# Patient Record
Sex: Female | Born: 1975 | Race: White | Hispanic: No | Marital: Single | Smoking: Never smoker
Health system: Southern US, Community
[De-identification: ages and names within clinical notes are randomized; demographics above are authoritative.]

---

## 2013-01-19 DIAGNOSIS — J45909 Unspecified asthma, uncomplicated: Secondary | ICD-10-CM | POA: Insufficient documentation

## 2013-02-15 DIAGNOSIS — K219 Gastro-esophageal reflux disease without esophagitis: Secondary | ICD-10-CM | POA: Insufficient documentation

## 2014-10-10 DIAGNOSIS — M545 Low back pain, unspecified: Secondary | ICD-10-CM | POA: Insufficient documentation

## 2014-10-10 DIAGNOSIS — M5416 Radiculopathy, lumbar region: Secondary | ICD-10-CM | POA: Insufficient documentation

## 2014-10-10 DIAGNOSIS — M47816 Spondylosis without myelopathy or radiculopathy, lumbar region: Secondary | ICD-10-CM | POA: Insufficient documentation

## 2014-10-10 DIAGNOSIS — M5136 Other intervertebral disc degeneration, lumbar region: Secondary | ICD-10-CM | POA: Insufficient documentation

## 2014-10-10 DIAGNOSIS — M51369 Other intervertebral disc degeneration, lumbar region without mention of lumbar back pain or lower extremity pain: Secondary | ICD-10-CM | POA: Insufficient documentation

## 2014-10-10 DIAGNOSIS — M48061 Spinal stenosis, lumbar region without neurogenic claudication: Secondary | ICD-10-CM | POA: Insufficient documentation

## 2014-10-12 ENCOUNTER — Other Ambulatory Visit: Payer: Self-pay | Admitting: Neurosurgery

## 2014-10-12 DIAGNOSIS — M48061 Spinal stenosis, lumbar region without neurogenic claudication: Secondary | ICD-10-CM

## 2014-10-16 ENCOUNTER — Other Ambulatory Visit: Payer: Self-pay

## 2014-10-17 ENCOUNTER — Ambulatory Visit
Admission: RE | Admit: 2014-10-17 | Discharge: 2014-10-17 | Disposition: A | Discharge status: Home / Self Care | Payer: Commercial Managed Care - PPO | Source: Ambulatory Visit | Attending: Neurosurgery | Admitting: Neurosurgery

## 2014-10-17 DIAGNOSIS — M48061 Spinal stenosis, lumbar region without neurogenic claudication: Secondary | ICD-10-CM

## 2014-10-17 MED ORDER — METHYLPREDNISOLONE ACETATE 40 MG/ML INJ SUSP (RADIOLOG
120.0000 mg | Freq: Once | INTRAMUSCULAR | Status: AC
Start: 2014-10-17 — End: 2014-10-17
  Administered 2014-10-17: 120 mg via EPIDURAL

## 2014-10-17 MED ORDER — IOHEXOL 180 MG/ML  SOLN
1.0000 mL | Freq: Once | INTRAMUSCULAR | Status: AC | PRN
  Administered 2014-10-17: 1 mL via EPIDURAL

## 2014-10-17 NOTE — Discharge Instructions (Signed)

## 2014-11-27 ENCOUNTER — Other Ambulatory Visit: Payer: Self-pay | Admitting: Neurosurgery

## 2014-11-27 DIAGNOSIS — M48061 Spinal stenosis, lumbar region without neurogenic claudication: Secondary | ICD-10-CM

## 2014-12-04 ENCOUNTER — Ambulatory Visit
Admission: RE | Admit: 2014-12-04 | Discharge: 2014-12-04 | Disposition: A | Discharge status: Home / Self Care | Payer: Commercial Managed Care - PPO | Source: Ambulatory Visit | Attending: Neurosurgery | Admitting: Neurosurgery

## 2014-12-04 DIAGNOSIS — M48061 Spinal stenosis, lumbar region without neurogenic claudication: Secondary | ICD-10-CM

## 2014-12-04 MED ORDER — METHYLPREDNISOLONE ACETATE 40 MG/ML INJ SUSP (RADIOLOG
120.0000 mg | Freq: Once | INTRAMUSCULAR | Status: AC
  Administered 2014-12-04: 120 mg via EPIDURAL

## 2014-12-04 MED ORDER — IOHEXOL 180 MG/ML  SOLN
1.0000 mL | Freq: Once | INTRAMUSCULAR | Status: AC | PRN
Start: 2014-12-04 — End: 2014-12-04
  Administered 2014-12-04: 1 mL via EPIDURAL

## 2014-12-04 NOTE — Discharge Instructions (Signed)

## 2015-02-01 ENCOUNTER — Other Ambulatory Visit: Payer: Self-pay | Admitting: Neurosurgery

## 2015-02-01 DIAGNOSIS — M48061 Spinal stenosis, lumbar region without neurogenic claudication: Secondary | ICD-10-CM

## 2015-02-08 ENCOUNTER — Ambulatory Visit
Admission: RE | Admit: 2015-02-08 | Discharge: 2015-02-08 | Disposition: A | Discharge status: Home / Self Care | Payer: Commercial Managed Care - PPO | Source: Ambulatory Visit | Attending: Neurosurgery | Admitting: Neurosurgery

## 2015-02-08 DIAGNOSIS — M48061 Spinal stenosis, lumbar region without neurogenic claudication: Secondary | ICD-10-CM

## 2015-02-08 MED ORDER — IOHEXOL 180 MG/ML  SOLN
1.0000 mL | Freq: Once | INTRAMUSCULAR | Status: DC | PRN
  Administered 2015-02-08: 1 mL via EPIDURAL

## 2015-02-08 MED ORDER — METHYLPREDNISOLONE ACETATE 40 MG/ML INJ SUSP (RADIOLOG
120.0000 mg | Freq: Once | INTRAMUSCULAR | Status: AC
  Administered 2015-02-08: 120 mg via EPIDURAL

## 2019-02-11 ENCOUNTER — Emergency Department (HOSPITAL_BASED_OUTPATIENT_CLINIC_OR_DEPARTMENT_OTHER): Payer: PRIVATE HEALTH INSURANCE

## 2019-02-11 ENCOUNTER — Encounter (HOSPITAL_BASED_OUTPATIENT_CLINIC_OR_DEPARTMENT_OTHER): Payer: Self-pay | Admitting: Emergency Medicine

## 2019-02-11 ENCOUNTER — Emergency Department (HOSPITAL_BASED_OUTPATIENT_CLINIC_OR_DEPARTMENT_OTHER)
Admission: EM | Admit: 2019-02-11 | Discharge: 2019-02-11 | Disposition: A | Discharge status: Home / Self Care | Payer: PRIVATE HEALTH INSURANCE | Attending: Emergency Medicine | Admitting: Emergency Medicine

## 2019-02-11 ENCOUNTER — Other Ambulatory Visit: Payer: Self-pay

## 2019-02-11 DIAGNOSIS — Z79899 Other long term (current) drug therapy: Secondary | ICD-10-CM | POA: Insufficient documentation

## 2019-02-11 DIAGNOSIS — Z888 Allergy status to other drugs, medicaments and biological substances status: Secondary | ICD-10-CM | POA: Diagnosis not present

## 2019-02-11 DIAGNOSIS — Z20822 Contact with and (suspected) exposure to covid-19: Secondary | ICD-10-CM

## 2019-02-11 DIAGNOSIS — U071 COVID-19: Secondary | ICD-10-CM | POA: Insufficient documentation

## 2019-02-11 DIAGNOSIS — R509 Fever, unspecified: Secondary | ICD-10-CM | POA: Diagnosis present

## 2019-02-11 NOTE — ED Triage Notes (Signed)
Pt c/o fever of 102. She took motrin at 1700. Also states "I feel short of breath in my throat and have nausea.".

## 2019-02-11 NOTE — ED Provider Notes (Signed)
[facility] Provider Note   CSN: [id number] Arrival date & time: 02/11/19  1748     History   Chief Complaint Chief Complaint  Patient presents with  . Fever    HPI Tammy Becker is a 43 y.o. female.     The history is provided by the patient.  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  Also patient recently had a new foster child come to her home and started having congestion today as well   History reviewed. No pertinent past medical history.  Patient Active Problem List   Diagnosis Date Noted  . Lumbar radiculopathy 10/10/2014  . DDD (degenerative disc disease), lumbar 10/10/2014  . LBP (low back pain) 10/10/2014  . 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 Lumbar canal stenosis 10/10/2014  . Acid reflux 02/15/2013  . Airway hyperreactivity 01/19/2013    History reviewed. No pertinent surgical history.   OB History   No obstetric history on file.      Home Medications    Prior to Admission medications   Medication Sig Start Date End Date Taking? Authorizing Provider  Beclomethasone Dipropionate (QNASL) 80 MCG/ACT AERS Frequency:daily   Dosage:80   MCG/ACT  Instructions:Qnasl 80MCG/ACT, 2 (two) Spray daily  Note: 01/25/13   [provider]  fexofenadine (ALLEGRA) 180 MG tablet Take 180 mg by mouth.    [provider]  HYDROcodone-acetaminophen (NORCO/VICODIN) 5-325 MG per  tablet Take by mouth. 10/10/14   [provider]    Family History No family history on file.  Social History Social History   Tobacco Use  . Smoking status: Never Smoker  . Smokeless tobacco: Never Used  Substance Use Topics  . Alcohol use: Yes    Alcohol/week: 0.0 standard drinks  . Drug use: Not on file     Allergies   Salicylates   Review of Systems Review of Systems  Constitutional: Positive for chills and fever.  HENT: Negative for rhinorrhea.   Respiratory: Positive for cough.   Gastrointestinal: Positive for diarrhea and nausea. Negative for vomiting.  Musculoskeletal: Negative for myalgias.  All other systems reviewed and are negative.    Physical Exam Updated Vital Signs BP (!) 113/53 (BP Location: Left Arm)   Pulse 93   Temp 100.1 F (37.8 C) (Oral)   Resp 18   Ht 5\' 3"  (1.6 m)   Wt 97.5 kg   LMP 01/31/2019   SpO2 96%   BMI 38.09 kg/m   Physical Exam Vitals signs and nursing note reviewed.  Constitutional:      General: She is not in acute distress.    Appearance: She is well-developed.  HENT:     Head: Normocephalic and atraumatic.     Right Ear: Tympanic membrane normal.     Left Ear: Tympanic membrane normal.     Nose: Nose normal.     Mouth/Throat:     Mouth: Mucous membranes are moist.     Pharynx:  Oropharynx is clear. No posterior oropharyngeal erythema.  Eyes:     Conjunctiva/sclera: Conjunctivae normal.     Pupils: Pupils are equal, round, and reactive to light.  Neck:     Musculoskeletal: Normal range of motion and neck supple.  Cardiovascular:     Rate and Rhythm: Normal rate and regular rhythm.     Heart sounds: No murmur.  Pulmonary:     Effort: Pulmonary effort is normal. No respiratory distress.     Breath sounds: Normal breath sounds. No wheezing or rales.  Abdominal:     General: There is no distension.     Palpations: Abdomen is soft.     Tenderness: There is no abdominal tenderness. There is no guarding or  rebound.  Musculoskeletal: Normal range of motion.        General: No tenderness.  Skin:    General: Skin is warm and dry.     Findings: No erythema or rash.  Neurological:     General: No focal deficit present.     Mental Status: She is alert and oriented to person, place, and time. Mental status is at baseline.  Psychiatric:        Mood and Affect: Mood normal.        Behavior: Behavior normal.        Thought Content: Thought content normal.      ED Treatments / Results  Labs (all labs ordered are listed, but only abnormal results are displayed) Labs Reviewed  NOVEL CORONAVIRUS, NAA (HOSP ORDER, SEND-OUT TO REF LAB; TAT 18-24 HRS)    EKG None  Radiology Dg Chest Port 1 View  Result Date: 02/11/2019 CLINICAL DATA:  Fever and shortness of breath. EXAM: PORTABLE CHEST 1 VIEW COMPARISON:  None. FINDINGS: The cardiomediastinal silhouette is unremarkable. Minimal bibasilar atelectasis noted. There is no evidence of focal airspace disease, pulmonary edema, suspicious pulmonary nodule/mass, pleural effusion, or pneumothorax. No acute bony abnormalities are identified. IMPRESSION: Minimal bibasilar atelectasis. Electronically Signed   By: [facility] Pier M.D.   On: 02/11/2019 21:17    Procedures Procedures (including critical care time)  Medications Ordered in ED Medications - No data to display   Initial Impression / Assessment and Plan / ED Course  I have reviewed the triage vital signs and the nursing notes.  Pertinent labs & imaging results that were available during my care of the patient were reviewed by me and considered in my medical decision making (see chart for details).        [name] was evaluated in Emergency Department on 02/11/2019 for the symptoms described in the history of present illness. She was evaluated in the context of the global COVID-19 pandemic, which necessitated consideration that the patient might be at risk for infection with the SARS-CoV-2  virus that causes COVID-19. Institutional protocols and algorithms that pertain to the evaluation of patients at risk for COVID-19 are in a state of rapid change based on information released by regulatory bodies including the CDC and federal and state organizations. These policies and algorithms were followed during the patient's care in the ED.  Patient presenting with symptoms concerning for COVID with fever, congestion, nausea and some diarrhea.  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  She is complaining of some mild shortness of breath but saturations are 96% on room air.  Breath sounds sound clear and chest x-ray without acute findings.  COVID testing done and patient given quarantine precautions.  At this time appears safe  for discharge.  She has no focal abdominal pain, urinary symptoms or other source for fever at this time.  Suspect viral.  Chest x-ray shows minimal atelectasis but no other acute findings.  Final Clinical Impressions(s) / ED Diagnoses   Final diagnoses:  Suspected Covid-19 Virus Infection    ED Discharge Orders    None       Gwyneth SproutPlunkett, Karan Ramnauth, MD 02/11/19 2144

## 2019-02-11 NOTE — Discharge Instructions (Signed)
Your COVID test will hopefully come back by tomorrow or Monday.  You need to quarantine for 14 days and be fever free for at least 3 days

## 2019-02-12 LAB — SARS CORONAVIRUS 2 (TAT 6-24 HRS): SARS Coronavirus 2: POSITIVE — AB

## 2019-12-21 IMAGING — DX PORTABLE CHEST - 1 VIEW
1 series · 1 of 1 positions shown · non-contrast
Comparison: None.

CLINICAL DATA: Fever and shortness of breath.

EXAM:
PORTABLE CHEST 1 VIEW

[chest ap]
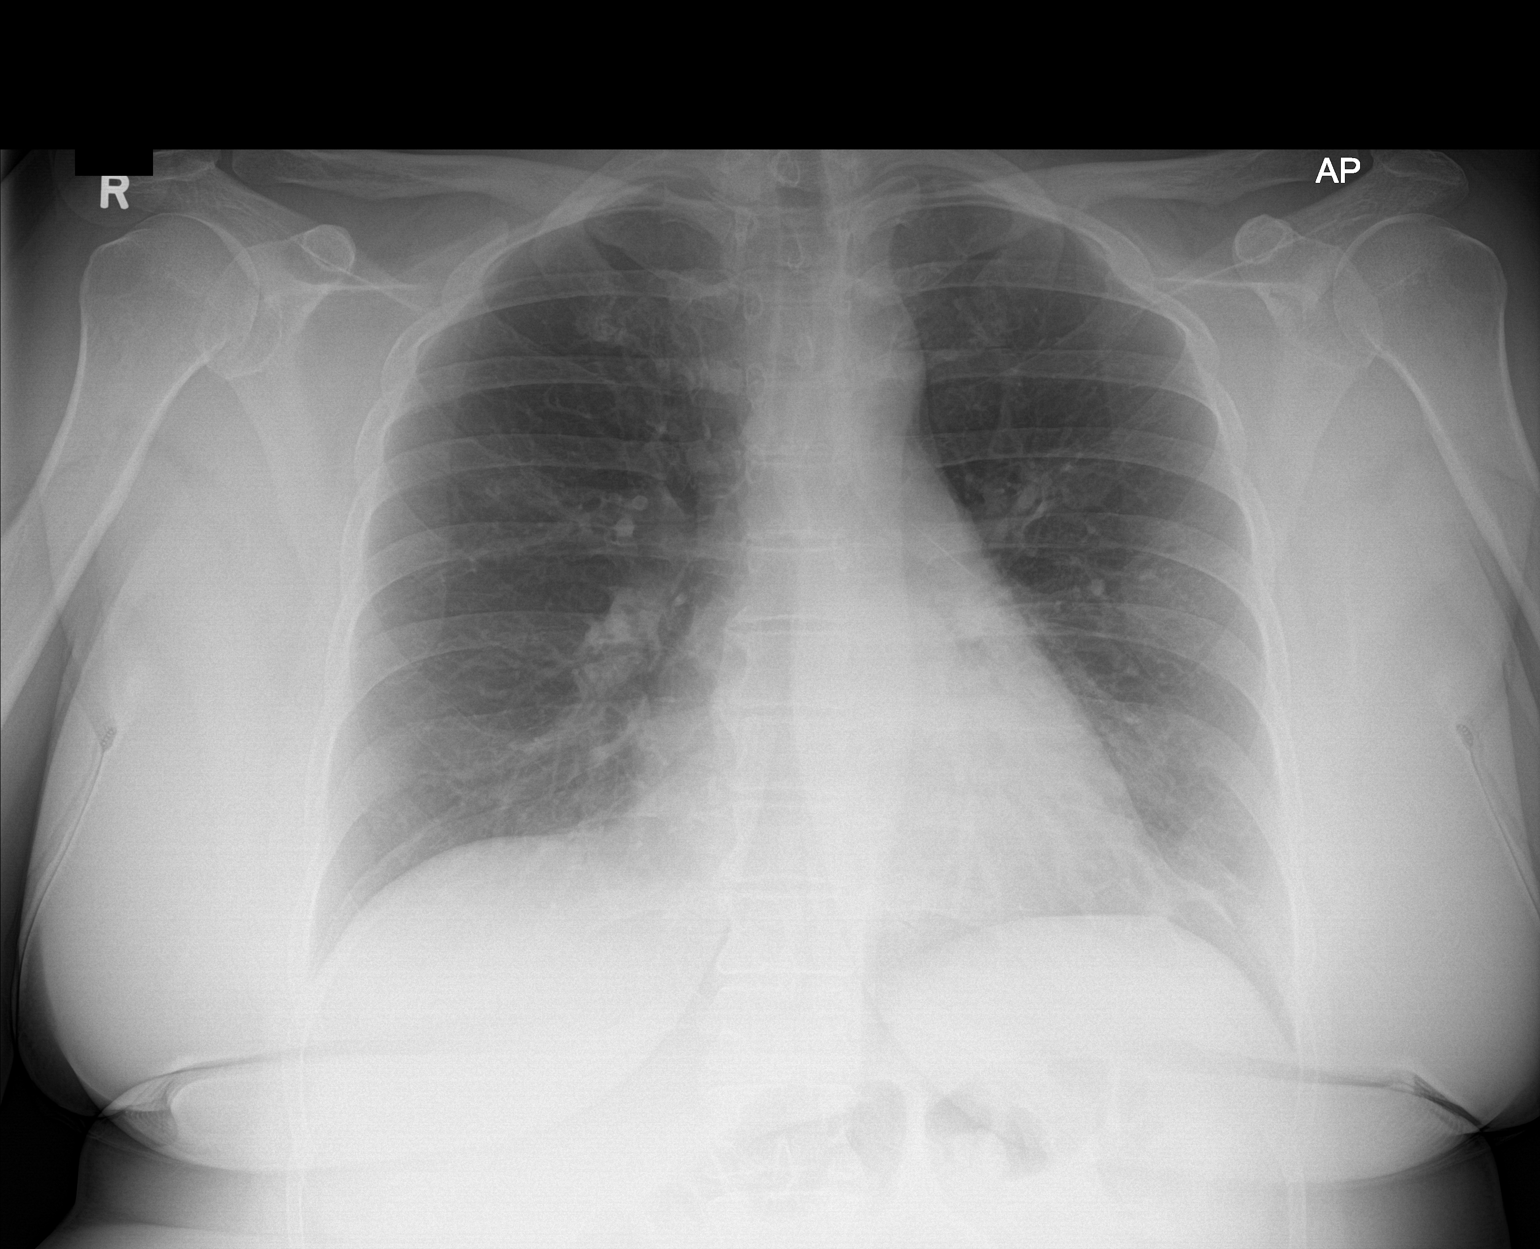

[1 of 1 positions shown; findings below may reference images not displayed]

FINDINGS: The cardiomediastinal silhouette is unremarkable.

Minimal bibasilar atelectasis noted.

There is no evidence of focal airspace disease, pulmonary edema,
suspicious pulmonary nodule/mass, pleural effusion, or pneumothorax.

No acute bony abnormalities are identified.
IMPRESSION: Minimal bibasilar atelectasis.
